# Patient Record
Sex: Male | Born: 1990 | Hispanic: No
Health system: Southern US, Community
[De-identification: ages and names within clinical notes are randomized; demographics above are authoritative.]

---

## 2016-09-15 ENCOUNTER — Emergency Department (HOSPITAL_COMMUNITY): Payer: No Typology Code available for payment source

## 2016-09-15 ENCOUNTER — Encounter (HOSPITAL_COMMUNITY): Payer: Self-pay | Admitting: Emergency Medicine

## 2016-09-15 ENCOUNTER — Emergency Department (HOSPITAL_COMMUNITY)
Admission: EM | Admit: 2016-09-15 | Discharge: 2016-09-15 | Disposition: A | Discharge status: Home / Self Care | Payer: No Typology Code available for payment source | Attending: Emergency Medicine | Admitting: Emergency Medicine

## 2016-09-15 DIAGNOSIS — M549 Dorsalgia, unspecified: Secondary | ICD-10-CM

## 2016-09-15 DIAGNOSIS — S39012A Strain of muscle, fascia and tendon of lower back, initial encounter: Secondary | ICD-10-CM | POA: Insufficient documentation

## 2016-09-15 DIAGNOSIS — R51 Headache: Secondary | ICD-10-CM | POA: Insufficient documentation

## 2016-09-15 DIAGNOSIS — M79622 Pain in left upper arm: Secondary | ICD-10-CM | POA: Diagnosis not present

## 2016-09-15 DIAGNOSIS — Y999 Unspecified external cause status: Secondary | ICD-10-CM | POA: Insufficient documentation

## 2016-09-15 DIAGNOSIS — Y939 Activity, unspecified: Secondary | ICD-10-CM | POA: Diagnosis not present

## 2016-09-15 DIAGNOSIS — Y92410 Unspecified street and highway as the place of occurrence of the external cause: Secondary | ICD-10-CM | POA: Insufficient documentation

## 2016-09-15 MED ORDER — HYDROCODONE-ACETAMINOPHEN 5-325 MG PO TABS
1.0000 | ORAL_TABLET | Freq: Once | ORAL | Status: AC
  Administered 2016-09-15: 1 via ORAL
  Filled 2016-09-15: qty 1
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
## 2016-09-15 NOTE — ED Notes (Signed)
Pt declined wheelchair upon discharge.  Pt ambulatory with steady gait
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
## 2023-08-13 DIAGNOSIS — R45851 Suicidal ideations: Secondary | ICD-10-CM | POA: Insufficient documentation

## 2023-08-13 DIAGNOSIS — Z79899 Other long term (current) drug therapy: Secondary | ICD-10-CM | POA: Insufficient documentation

## 2023-08-13 DIAGNOSIS — R456 Violent behavior: Secondary | ICD-10-CM | POA: Insufficient documentation

## 2023-08-13 DIAGNOSIS — F329 Major depressive disorder, single episode, unspecified: Secondary | ICD-10-CM | POA: Insufficient documentation

## 2023-08-13 DIAGNOSIS — F419 Anxiety disorder, unspecified: Secondary | ICD-10-CM | POA: Insufficient documentation

## 2023-08-14 ENCOUNTER — Emergency Department (HOSPITAL_COMMUNITY)
Admission: EM | Admit: 2023-08-14 | Discharge: 2023-08-14 | Disposition: A | Discharge status: Other Acute Inpatient Hospital | Attending: Emergency Medicine | Admitting: Emergency Medicine

## 2023-08-14 ENCOUNTER — Encounter (HOSPITAL_COMMUNITY): Payer: Self-pay

## 2023-08-14 ENCOUNTER — Other Ambulatory Visit: Payer: Self-pay

## 2023-08-14 ENCOUNTER — Encounter (HOSPITAL_COMMUNITY): Payer: Self-pay | Admitting: Psychiatry

## 2023-08-14 ENCOUNTER — Inpatient Hospital Stay (HOSPITAL_COMMUNITY)
Admission: AD | Admit: 2023-08-14 | Discharge: 2023-08-18 | DRG: 885 | Disposition: A | Discharge status: Home / Self Care | Source: Intra-hospital | Attending: Student in an Organized Health Care Education/Training Program | Admitting: Student in an Organized Health Care Education/Training Program

## 2023-08-14 DIAGNOSIS — F322 Major depressive disorder, single episode, severe without psychotic features: Secondary | ICD-10-CM | POA: Diagnosis present

## 2023-08-14 DIAGNOSIS — F411 Generalized anxiety disorder: Secondary | ICD-10-CM | POA: Insufficient documentation

## 2023-08-14 DIAGNOSIS — R45851 Suicidal ideations: Secondary | ICD-10-CM | POA: Diagnosis present

## 2023-08-14 DIAGNOSIS — F1210 Cannabis abuse, uncomplicated: Secondary | ICD-10-CM | POA: Diagnosis present

## 2023-08-14 DIAGNOSIS — F17210 Nicotine dependence, cigarettes, uncomplicated: Secondary | ICD-10-CM | POA: Diagnosis present

## 2023-08-14 DIAGNOSIS — Y906 Blood alcohol level of 120-199 mg/100 ml: Secondary | ICD-10-CM | POA: Diagnosis present

## 2023-08-14 DIAGNOSIS — F419 Anxiety disorder, unspecified: Secondary | ICD-10-CM | POA: Diagnosis not present

## 2023-08-14 DIAGNOSIS — F329 Major depressive disorder, single episode, unspecified: Principal | ICD-10-CM | POA: Diagnosis present

## 2023-08-14 DIAGNOSIS — Z9151 Personal history of suicidal behavior: Secondary | ICD-10-CM | POA: Diagnosis not present

## 2023-08-14 DIAGNOSIS — F1010 Alcohol abuse, uncomplicated: Secondary | ICD-10-CM | POA: Diagnosis present

## 2023-08-14 DIAGNOSIS — Z046 Encounter for general psychiatric examination, requested by authority: Secondary | ICD-10-CM

## 2023-08-14 DIAGNOSIS — Z560 Unemployment, unspecified: Secondary | ICD-10-CM | POA: Diagnosis not present

## 2023-08-14 DIAGNOSIS — F332 Major depressive disorder, recurrent severe without psychotic features: Secondary | ICD-10-CM | POA: Diagnosis not present

## 2023-08-14 LAB — RAPID URINE DRUG SCREEN, HOSP PERFORMED
Amphetamines: NOT DETECTED
Barbiturates: NOT DETECTED
Benzodiazepines: NOT DETECTED
Cocaine: NOT DETECTED
Opiates: NOT DETECTED
Tetrahydrocannabinol: POSITIVE — AB

## 2023-08-14 LAB — COMPREHENSIVE METABOLIC PANEL WITH GFR
ALT: 26 U/L (ref 0–44)
AST: 24 U/L (ref 15–41)
Albumin: 4.9 g/dL (ref 3.5–5.0)
Alkaline Phosphatase: 33 U/L — ABNORMAL LOW (ref 38–126)
Anion gap: 13 (ref 5–15)
BUN: 8 mg/dL (ref 6–20)
CO2: 21 mmol/L — ABNORMAL LOW (ref 22–32)
Calcium: 8.9 mg/dL (ref 8.9–10.3)
Chloride: 102 mmol/L (ref 98–111)
Creatinine, Ser: 0.78 mg/dL (ref 0.61–1.24)
GFR, Estimated: >60 mL/min (ref >60)
Glucose, Bld: 91 mg/dL (ref 70–99)
Potassium: 3.5 mmol/L (ref 3.5–5.1)
Sodium: 136 mmol/L (ref 135–145)
Total Bilirubin: 0.9 mg/dL (ref 0.0–1.2)
Total Protein: 8.6 g/dL — ABNORMAL HIGH (ref 6.5–8.1)

## 2023-08-14 LAB — CBC WITH DIFFERENTIAL/PLATELET
Abs Immature Granulocytes: 0.01 K/uL (ref 0.00–0.07)
Basophils Absolute: 0 K/uL (ref 0.0–0.1)
Basophils Relative: 0 %
Eosinophils Absolute: 0.2 K/uL (ref 0.0–0.5)
Eosinophils Relative: 2 %
HCT: 46.8 % (ref 39.0–52.0)
Hemoglobin: 16.1 g/dL (ref 13.0–17.0)
Immature Granulocytes: 0 %
Lymphocytes Relative: 22 %
Lymphs Abs: 1.9 K/uL (ref 0.7–4.0)
MCH: 31.4 pg (ref 26.0–34.0)
MCHC: 34.4 g/dL (ref 30.0–36.0)
MCV: 91.2 fL (ref 80.0–100.0)
Monocytes Absolute: 0.6 K/uL (ref 0.1–1.0)
Monocytes Relative: 7 %
Neutro Abs: 5.8 K/uL (ref 1.7–7.7)
Neutrophils Relative %: 69 %
Platelets: 235 K/uL (ref 150–400)
RBC: 5.13 MIL/uL (ref 4.22–5.81)
RDW: 11.8 % (ref 11.5–15.5)
WBC: 8.5 K/uL (ref 4.0–10.5)
nRBC: 0 % (ref 0.0–0.2)

## 2023-08-14 LAB — SALICYLATE LEVEL: Salicylate Lvl: <7 mg/dL — ABNORMAL LOW (ref 7.0–30.0)

## 2023-08-14 LAB — ACETAMINOPHEN LEVEL: Acetaminophen (Tylenol), Serum: <10 ug/mL — ABNORMAL LOW (ref 10–30)

## 2023-08-14 LAB — ETHANOL: Alcohol, Ethyl (B): 181 mg/dL — ABNORMAL HIGH (ref <15)

## 2023-08-14 MED ORDER — DIPHENHYDRAMINE HCL 25 MG PO CAPS: 50.0000 mg | ORAL_CAPSULE | Freq: Three times a day (TID) | ORAL | Status: DC | PRN

## 2023-08-14 MED ORDER — HALOPERIDOL 5 MG PO TABS: 5.0000 mg | ORAL_TABLET | Freq: Three times a day (TID) | ORAL | Status: DC | PRN

## 2023-08-14 MED ORDER — LORAZEPAM 2 MG/ML IJ SOLN: 2.0000 mg | Freq: Three times a day (TID) | INTRAMUSCULAR | Status: DC | PRN

## 2023-08-14 MED ORDER — MAGNESIUM HYDROXIDE 400 MG/5ML PO SUSP: 30.0000 mL | Freq: Every day | ORAL | Status: DC | PRN

## 2023-08-14 MED ORDER — DIPHENHYDRAMINE HCL 50 MG/ML IJ SOLN: 50.0000 mg | Freq: Three times a day (TID) | INTRAMUSCULAR | Status: DC | PRN

## 2023-08-14 MED ORDER — ALUM & MAG HYDROXIDE-SIMETH 200-200-20 MG/5ML PO SUSP: 30.0000 mL | ORAL | Status: DC | PRN

## 2023-08-14 MED ORDER — ACETAMINOPHEN 325 MG PO TABS: 650.0000 mg | ORAL_TABLET | Freq: Four times a day (QID) | ORAL | Status: DC | PRN

## 2023-08-14 MED ORDER — HALOPERIDOL LACTATE 5 MG/ML IJ SOLN: 10.0000 mg | Freq: Three times a day (TID) | INTRAMUSCULAR | Status: DC | PRN

## 2023-08-14 MED ORDER — DIPHENHYDRAMINE HCL 25 MG PO CAPS
25.0000 mg | ORAL_CAPSULE | Freq: Four times a day (QID) | ORAL | Status: DC | PRN
  Administered 2023-08-14: 25 mg via ORAL
  Filled 2023-08-14: qty 1

## 2023-08-14 MED ORDER — NICOTINE POLACRILEX 2 MG MT GUM
2.0000 mg | CHEWING_GUM | OROMUCOSAL | Status: DC | PRN
  Administered 2023-08-14 – 2023-08-17 (×14): 2 mg via ORAL
  Filled 2023-08-14 (×10): qty 1

## 2023-08-14 MED ORDER — HYDROXYZINE HCL 25 MG PO TABS
25.0000 mg | ORAL_TABLET | Freq: Three times a day (TID) | ORAL | Status: DC | PRN
  Filled 2023-08-14 (×2): qty 1

## 2023-08-14 MED ORDER — HALOPERIDOL LACTATE 5 MG/ML IJ SOLN: 5.0000 mg | Freq: Three times a day (TID) | INTRAMUSCULAR | Status: DC | PRN

## 2023-08-14 NOTE — Tx Team (Signed)
 Initial Treatment Plan 08/14/2023 3:37 PM Marsa CHRISTELLA Bohr FMW:[id number]    PATIENT STRESSORS: Legal issue   Marital or family conflict     PATIENT STRENGTHS: Average or above average intelligence  Capable of independent living  Motivation for treatment/growth    PATIENT IDENTIFIED PROBLEMS: I want to be happy again.  Coping skills                   DISCHARGE CRITERIA:  Adequate post-discharge living arrangements Improved stabilization in mood, thinking, and/or behavior  PRELIMINARY DISCHARGE PLAN: Attend 12-step recovery group Outpatient therapy  PATIENT/FAMILY INVOLVEMENT: This treatment plan has been presented to and reviewed with the patient, DRAYCE TAWIL.  The patient has been given the opportunity to ask questions and make suggestions.  Eleanor JAYSON Metro, RN 08/14/2023, 3:37 PM

## 2023-08-14 NOTE — [facility] Group Notes (Signed)
 [facility] Group Notes:  (Nursing/MHT/Case Management/Adjunct)  Date:  08/14/2023  Time:  8:50 PM  Type of Therapy:  Wrap-up group  Participation Level:  Did Not Attend  Participation Quality:    Affect:    Cognitive:    Insight:    Engagement in Group:    Modes of Intervention:    Summary of Progress/Problems: Didn't attend.  Grayce LITTIE Essex 08/14/2023, 8:50 PM

## 2023-08-14 NOTE — Progress Notes (Signed)
   08/14/23 2130  Psych Admission Type (Psych Patients Only)  Admission Status Involuntary  Psychosocial Assessment  Patient Complaints Anxiety;Depression;Worrying  Eye Contact Fair  Facial Expression Flat  Affect Depressed;Sad  Speech Logical/coherent  Interaction No initiation  Motor Activity Other (Comment) (wnl)  Appearance/Hygiene Unremarkable  Behavior Characteristics Cooperative  Mood Pleasant  Thought Process  Coherency WDL  Content WDL  Delusions None reported or observed  Perception WDL  Hallucination None reported or observed  Judgment WDL  Confusion None  Danger to Self  Current suicidal ideation? Denies

## 2023-08-14 NOTE — Plan of Care (Signed)
  Problem: Activity: Goal: Interest or engagement in leisure activities will improve Outcome: Progressing Goal: Imbalance in normal sleep/wake cycle will improve Outcome: Progressing   Problem: Coping: Goal: Coping ability will improve Outcome: Progressing

## 2023-08-14 NOTE — ED Notes (Addendum)
 Sheriff called for transport, did not state any time but stated that he is on the list. Called report at [facility] receiving RN, Melissa, R. Pt going to room 300 bed 2.
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
## 2023-08-14 NOTE — ED Notes (Signed)
 Patient asleep at this time.

## 2023-08-14 NOTE — Consult Note (Signed)
 [facility] Psychiatric Consult Initial  Patient Name: .Vincent Carrillo  MRN: [id number]  DOB: February 09, 1990  Consult Order details:  Orders (From admission, onward)     Start     Ordered   08/14/23 0231  CONSULT TO CALL ACT TEAM       Ordering Provider: Jarold Olam CHRISTELLA, PA-C  Provider:  (Not yet assigned)  Question:  Reason for Consult?  Answer:  Psych consult   08/14/23 0230             Mode of Visit: In person    Psychiatry Consult Evaluation  Service Date: August 14, 2023 LOS:  LOS: 0 days  Chief Complaint found by [employer] leaning over the bridge railing [place] a highway. PT stated that he was done, and defeated.   Primary Psychiatric Diagnoses  Major depressive disorder 2.   Anxiety disorder   Assessment  Vincent Carrillo is a 33 y.o. male admitted: Presented to the EDfor 08/14/2023 12:02 AM for that he was done and defeated while leaning over a bridge railing. He carries the psychiatric diagnoses of none and has a past medical history of none.   His current presentation of irritability, minimizing, despair and defeat is most consistent with decompensated mental health. He meets criteria for inpatient psychiatric admission based on current symptoms.  Current outpatient psychotropic medications include none. On initial examination, patient irritable, but cooperative. Please see plan below for detailed recommendations.   Diagnoses:  Active Hospital problems: Principal Problem:   MDD (major depressive disorder) Active Problems:   Anxiety state    Plan   ## Psychiatric Medication Recommendations:  Atarax  25 mg p.o. 3 times daily as needed for anxiety  ## Medical Decision Making Capacity: Not specifically addressed in this encounter  ## Further Work-up:  -- No further workup needed at this time EKG or UDS -- most recent EKG on 08/14/2023 had QtC of 425 -- Pertinent labwork reviewed earlier this admission includes: CBC, CMP, EKG, UDS   ## Disposition:-- We  recommend inpatient psychiatric hospitalization. Patient has been involuntarily committed on 08/13/2023.  Patient has been too accepted to the behavioral health Hospital on 08/14/2018.  ## Behavioral / Environmental: -To minimize splitting of staff, assign one staff person to communicate all information from the team when feasible. or Utilize compassion and acknowledge the patient's experiences while setting clear and realistic expectations for care.    ## Safety and Observation Level:  - Based on my clinical evaluation, I estimate the patient to be at low  risk of self harm in the current setting. - At this time, we recommend  routine. This decision is based on my review of the chart including patient's history and current presentation, interview of the patient, mental status examination, and consideration of suicide risk including evaluating suicidal ideation, plan, intent, suicidal or self-harm behaviors, risk factors, and protective factors. This judgment is based on our ability to directly address suicide risk, implement suicide prevention strategies, and develop a safety plan while the patient is in the clinical setting. Please contact our team if there is a concern that risk level has changed.  CSSR Risk Category:C-SSRS RISK CATEGORY: High Risk  Suicide Risk Assessment: Patient has following modifiable risk factors for suicide: untreated depression, recklessness, current symptoms: anxiety/panic, insomnia, impulsivity, anhedonia, hopelessness, and triggering events, which we are addressing by recommendation patient's psychiatric admission. Patient has following non-modifiable or demographic risk factors for suicide: male gender and history of suicide attempt Patient has the following protective factors  against suicide: Supportive family  Thank you for this consult request. Recommendations have been communicated to the primary team.  We will continue to follow patient at this time.   CATHALEEN ADAM, PMHNP       History of Present Illness  Relevant Aspects of Hospital ED Course:  Admitted on 08/14/2023 33 year old male with history of bipolar disorder, presenting to the ED via [employer] and placed under emergency IVC. Per police, he was found leaning over the side of a bridge and was pulled back to safety. Apparently had stated to police that he was done. Does have history of prior suicide attempts.   Patient Report:  Vincent Carrillo, 33 y.o., male patient seen face to face by this provider, consulted with Dr. Larina; and chart reviewed on 08/14/23.  On evaluation Vincent Carrillo reports that he was walking along a bridge, smoking a cigarette, he was just playing over the bridge, with no intent of jumping over.  Patient is minimizing and guarded with story, not forthcoming about her [facility] Department was called or involved, minimizes why he was at the [place].  He states that he currently lives with his parents, but most days he lives off and on with his girlfriend.  States he currently is not working due to [employer], states he is struggling financially because of the DUIs, states he walks everywhere, does not drive anymore.  He states that he has a good relationship with his parents, denies ever being admitted to inpatient psychiatric hospital.  Currently denies SI/HI/AVH.  States that he does see a therapist once a week through [facility], denies taking any medication, states he has tried before Xanax, and Zoloft prescriptions, states that it made him feel out of place, he was not comfortable being on the medications.  He continues to state that the past 4 years he feels that his life has gone downhill due to his relationship with his girlfriend and because of the DUIs.  He states his appetite has been poor and he eats about once a day, sleeps about 6 hours a night.  Patient endorses smoking marijuana about 3 to 6 g a week, and drinks liquor/beer about once or twice a  week, BAL is 181.   During evaluation Vincent Carrillo is in moderate distress, as he feels he does not need to be here.  He is alert, oriented x 4, labile mood, but cooperative.  His mood is labile with congruent affect. He has normal speech, and behavior.  Objectively there is no evidence of psychosis/mania or delusional thinking.  Patient is able to converse coherently, goal directed thoughts, no distractibility, or pre-occupation. He currently denies suicidal/self-harm/homicidal ideation, psychosis, and paranoia.   Patient does not have any past inpatient psychiatric admission, but did inform the police officer last night that he may have bipolar disorder with no formal diagnosis, patient was brought down from side of the bridge by police after threatening to jump off.  Patient states that when he was a teenager he did have a history of prior suicide attempt.   Psych ROS:  Depression: Endorses Anxiety: Endorses Mania (lifetime and current): Denies Psychosis: (lifetime and current): Denies  Collateral information:  Spoke with patient's mother Edsel Hailstone, with patient's permission, who was here at the emergency department with her son.  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  She states her son used to be a very strong man, making his own decisions, but now he depends on his girlfriend for everything.  She states there is a family history of mental health, her sister has depression.  She states she is unsure of patient intentionally trying to harm himself or if he is trying to get attention.  She states that last conversation he did tell her that he was feeling lost.   Review of Systems  Psychiatric/Behavioral:  Positive for depression, substance abuse and suicidal ideas.      Psychiatric and Social History  Psychiatric  History:  Information collected from patient mother and patient   Prev Dx/Sx: None Current Psych Provider: None Home Meds (current): None Previous Med Trials: Yes Therapy: Yes  Prior Psych Hospitalization: Denies   Prior Self Harm: Yes Prior Violence: Denies  Family Psych History: Yes Family Hx suicide: Denies  Social History:  Developmental Hx: deferred Educational Hx: Graduated high school Occupational Hx: Unemployed  Legal Hx: Yes Living Situation: Lives with parents  Spiritual Hx: Yes Access to weapons/lethal means: Denies    Substance History Alcohol: Yes  Type of alcohol beer/ liquor  Last Drink yesterday  Number of drinks per day occasionally  History of alcohol withdrawal seizures Denies  History of DT's Denies  Tobacco: Yes Illicit drugs: Yes Prescription drug abuse: Denies Rehab hx: Denies   Exam Findings  Physical Exam:  Vital Signs:  Temp:  [97.8 F (36.6 C)-98.3 F (36.8 C)] 98.3 F (36.8 C) (07/19 1024) Pulse Rate:  [73-94] 76 (07/19 1024) Resp:  [18] 18 (07/19 1024) BP: (115-130)/(68-95) 115/70 (07/19 1024) SpO2:  [96 %-99 %] 99 % (07/19 1024) Weight:  [88 kg] 88 kg (07/19 0007) Blood pressure 115/70, pulse 76, temperature 98.3 F (36.8 C), temperature source Oral, resp. rate 18, height 6' 2 (1.88 m), weight 88 kg, SpO2 99%. Body mass index is 24.91 kg/m.  Physical Exam Vitals and nursing note reviewed. Exam conducted with a chaperone present.  Neurological:     Mental Status: He is alert.  Psychiatric:        Attention and Perception: Attention normal.        Mood and Affect: Mood is depressed. Affect is labile.        Speech: Speech normal.        Behavior: Behavior is agitated. Behavior is cooperative.        Thought Content: Thought content includes suicidal ideation.        Cognition and Memory: Memory normal.        Judgment: Judgment is inappropriate.     Mental Status Exam: General Appearance: Casual  Orientation:  Full  (Time, Place, and Person)  Memory:  Immediate;   Fair Remote;   Fair  Concentration:  Concentration: Fair and Attention Span: Fair  Recall:  Fair  Attention  Fair  Eye Contact:  Fair  Speech:  Clear and Coherent  Language:  Fair  Volume:  Normal  Mood: labile  Affect:  Labile  Thought Process:  Coherent  Thought Content:  WDL  Suicidal Thoughts:  Yes.  without intent/plan  Homicidal Thoughts:  No  Judgement:  Intact  Insight:  Fair  Psychomotor Activity:  Normal  Akathisia:  NA  Fund of Knowledge:  Fair      Assets:  Manufacturing systems engineer Desire for Improvement Housing Social Support  Cognition:  WNL  ADL's:  Intact  AIMS (  if indicated):        Other History   These have been pulled in through the EMR, reviewed, and updated if appropriate.  Family History:  The patient's family history is not on file.  Medical History: History reviewed. No pertinent past medical history.  Surgical History: History reviewed. No pertinent surgical history.   Medications:  No current facility-administered medications for this encounter.  Current Outpatient Medications:    ibuprofen (ADVIL,MOTRIN) 200 MG tablet, Take 200-400 mg by mouth every 6 (six) hours as needed (for pain or headaches). , Disp: , Rfl:   Allergies: Allergies  Allergen Reactions   Peanut-Containing Drug Products Anaphylaxis    Mujtaba Bollig MOTLEY-MANGRUM, PMHNP

## 2023-08-14 NOTE — ED Triage Notes (Signed)
 PT was found by [employer] leaning [place] the bridge railing over a highway. PT stated that he was done, and defeated.

## 2023-08-14 NOTE — ED Provider Notes (Signed)
 [place] EMERGENCY DEPARTMENT AT [facility] Provider Note   CSN: [id number] Arrival date & time: 08/13/23  2359     Patient presents with: Suicidal (PT was found by [employer] leaning over the bridge railing over a highway. PT stated that he was done, and defeated.)   Vincent Carrillo is a 33 y.o. male.   The history is provided by the patient and medical records.   33 year old male with history of bipolar disorder, presenting to the ED via [employer] and placed under emergency IVC.  Per police, he was found leaning over the side of a bridge and was pulled back to safety.  Apparently had stated to police that he was done.  Does have history of prior suicide attempts.  Per patient, he contradicts statements made in IVC paperwork.  States he was just smoking a cigarette and thinking.  He denies suicidal intent.  He states if I wanted to kill myself I would not be here.  Prior to Admission medications   Medication Sig Start Date End Date Taking? Authorizing Provider  ibuprofen (ADVIL,MOTRIN) 200 MG tablet Take 200-400 mg by mouth every 6 (six) hours as needed (for pain or headaches).     [provider]    Allergies: Patient has no known allergies.    Review of Systems  Psychiatric/Behavioral:  Positive for suicidal ideas.   All other systems reviewed and are negative.   Updated Vital Signs BP (!) 130/95   Pulse 94   Temp 98.3 F (36.8 C)   Resp 18   Ht 6' 2 (1.88 m)   Wt 88 kg   SpO2 97%   BMI 24.91 kg/m   Physical Exam Vitals and nursing note reviewed.  Constitutional:      Appearance: He is well-developed.  HENT:     Head: Normocephalic and atraumatic.  Eyes:     Conjunctiva/sclera: Conjunctivae normal.     Pupils: Pupils are equal, round, and reactive to light.  Cardiovascular:     Rate and Rhythm: Normal rate and regular rhythm.     Heart sounds: Normal heart sounds.  Pulmonary:     Effort: Pulmonary effort is normal. No respiratory  distress.     Breath sounds: Normal breath sounds. No rhonchi.  Musculoskeletal:        General: Normal range of motion.     Cervical back: Normal range of motion.  Skin:    General: Skin is warm and dry.  Neurological:     Mental Status: He is alert and oriented to person, place, and time.  Psychiatric:     Comments: Denies SI when asked directly Denies HI/AVH     (all labs ordered are listed, but only abnormal results are displayed) Labs Reviewed  COMPREHENSIVE METABOLIC PANEL WITH GFR - Abnormal; Notable for the following components:      Result Value   CO2 21 (*)    Total Protein 8.6 (*)    Alkaline Phosphatase 33 (*)    All other components within normal limits  ETHANOL - Abnormal; Notable for the following components:   Alcohol, Ethyl (B) 181 (*)    All other components within normal limits  SALICYLATE LEVEL - Abnormal; Notable for the following components:   Salicylate Lvl <7.0 (*)    All other components within normal limits  ACETAMINOPHEN  LEVEL - Abnormal; Notable for the following components:   Acetaminophen  (Tylenol ), Serum <10 (*)    All other components within normal limits  CBC WITH DIFFERENTIAL/PLATELET  RAPID URINE DRUG SCREEN, HOSP PERFORMED    EKG: EKG Interpretation Date/Time:  Saturday August 14 2023 00:08:08 EDT Ventricular Rate:  96 PR Interval:  146 QRS Duration:  86 QT Interval:  336 QTC Calculation: 425 R Axis:   69  Text Interpretation: Sinus rhythm Confirmed by Nettie, April ([id number]) on 08/14/2023 12:12:32 AM  Radiology: No results found.   Procedures   Medications Ordered in the ED - No data to display                                  Medical Decision Making Amount and/or Complexity of Data Reviewed Labs: ordered. ECG/medicine tests: ordered and independent interpretation performed.   33 year old male brought in by [employer] under IVC after they pulled him back from the ledge of a bridge.  He was apparently threatening to jump.   Patient denies this stating if I am wanted to be dead I would be dead.  Denies current SI/HI.  Labs as above--no leukocytosis.  No significant electrolyte derangement.  Ethanol 181.  Negative APAP and salicylate levels.  UDS pending.  Medically cleared.  Will get TTS evaluation.  First exam filed.  Final diagnoses:  Involuntary commitment    ED Discharge Orders     None          Jarold Olam CHRISTELLA DEVONNA 08/14/23 0559    Palumbo, April, MD 08/14/23 [phone]

## 2023-08-14 NOTE — Plan of Care (Signed)
   Problem: Education: Goal: Emotional status will improve Outcome: Progressing Goal: Verbalization of understanding the information provided will improve Outcome: Progressing

## 2023-08-14 NOTE — ED Notes (Signed)
 Pt has been wanded and belongings in [place] B cabinet 2 bags one with clothes other bag pt boots both labeled

## 2023-08-14 NOTE — Group Note (Signed)
 Date/Time:  @TD @ 1:00-2:00  Type of Therapy and Topic:  Group Therapy:  Safety  Participation Level:  Did Not Attend   Description of Group This process group involved a discussion with and between patients about Safety.   This group revolves around central idea: You need to stay safe, the patient will learn to cope safely, no matter what negative life events come their way. The patient will identify and describe their safe and unsafe environment.   This then was followed by a discussion about how the patient are committed to their self, what it is, how important it is, and why we choose the coping techniques.  Participants were encouraged to think about commitment as necessary and positive.  Therapeutic Goals Patient will identify and describe one their safety Patient will participate in generating ideas about safety and how it can impact their environment The patient are encourage to explore their safety and who they would want to be their safety person.   Summary of Patient Progress:  The patient did not attend   Therapeutic Modalities Brief Solution-Focused Therapy Psychoeducation  Lizmary Nader O Keylee Shrestha, LCSWA 08/14/2023  4:27 PM

## 2023-08-15 DIAGNOSIS — F332 Major depressive disorder, recurrent severe without psychotic features: Secondary | ICD-10-CM | POA: Diagnosis not present

## 2023-08-15 NOTE — Progress Notes (Addendum)
 D. Pt presents with a flat affect, depressed mood, and calm, cooperative behavior. Pt insistent that he doesn't need to be here- that he wasn't planning on ending his life. Pt stated, I was just thinking about everything, smoking a cigarette. Pt currently denies SI/HI and AVH and doesn't appear to be responding to internal stimuli.  A. Labs and vitals monitored. Pt supported emotionally and encouraged to express concerns and ask questions.   R. Pt remains safe with 15 minute checks. Will continue POC.    08/15/23 1300  Psych Admission Type (Psych Patients Only)  Admission Status Involuntary  Psychosocial Assessment  Patient Complaints Worrying;Depression  Eye Contact Fair  Facial Expression Sad  Affect Depressed  Speech Logical/coherent  Interaction Minimal  Motor Activity Other (Comment) (steady gait)  Appearance/Hygiene Unremarkable  Behavior Characteristics Cooperative  Mood Depressed;Pleasant  Thought Process  Coherency WDL  Content WDL  Delusions None reported or observed  Perception WDL  Hallucination None reported or observed  Judgment WDL  Confusion None  Danger to Self  Current suicidal ideation? Denies  Danger to Others  Danger to Others None reported or observed

## 2023-08-15 NOTE — Plan of Care (Signed)
   Problem: Education: Goal: Knowledge of [place] [facility] Education information/materials will improve Outcome: Progressing Goal: Verbalization of understanding the information provided will improve Outcome: Progressing

## 2023-08-15 NOTE — Progress Notes (Signed)
(  Sleep Hours) -6 (Any PRNs that were needed, meds refused, or side effects to meds)- Benadryl  25 mg (Any disturbances and when (visitation, over night)- none (Concerns raised by the patient)-  (SI/HI/AVH)- denies all ,  pt reported he is concerned about how he is going to pay for this hospitalization b/c he has no insurance along with other personal stressors

## 2023-08-15 NOTE — Group Note (Signed)
 Date:  08/15/2023 Time:  9:07 PM  Group Topic/Focus:  Wrap-Up Group:   The focus of this group is to help patients review their daily goal of treatment and discuss progress on daily workbooks.    Participation Level:  Did Not Attend  Participation Quality:  Resistant  Affect:  Resistant  Cognitive:  Lacking  Insight: None  Engagement in Group:  None  Modes of Intervention:  Discussion  Additional Comments:  Patient did not attend wrap up group this evening   Bari Moats 08/15/2023, 9:07 PM

## 2023-08-15 NOTE — Group Note (Signed)
 Date:  08/15/2023 Time:  9:12 AM  Group Topic/Focus:  Goals Group:   The focus of this group is to help patients establish daily goals to achieve during treatment and discuss how the patient can incorporate goal setting into their daily lives to aide in recovery. Orientation:   The focus of this group is to educate the patient on the purpose and policies of crisis stabilization and provide a format to answer questions about their admission.  The group details unit policies and expectations of patients while admitted.    Participation Level:  Did Not Attend
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
## 2023-08-15 NOTE — BHH Suicide Risk Assessment (Signed)
 Suicide Risk Assessment  Admission Assessment    [facility] Admission Suicide Risk Assessment   Nursing information obtained from:    Demographic factors:  Male, Caucasian Current Mental Status:  NA Loss Factors:  Financial problems / change in socioeconomic status Historical Factors:  NA Risk Reduction Factors:  Positive social support, Living with another person, especially a relative  Total Time spent with patient: 30 minutes Principal Problem: MDD (major depressive disorder) Diagnosis:  Principal Problem:   MDD (major depressive disorder)   Subjective Data:   Vincent Carrillo is a 33 y.o. male  with no past psychiatric history . Patient initially arrived to [facility] on 7/19 for reported suicidal ideations, and admitted to [facility] under IVC on 7/19 for acute safety concerns and acute suicidal or self-harming behaviors. Per [employer], patient was found leaning over the side of a bridge and voiced SI to police (stated to police that he was 'done'.) Was involuntarily committed in the ED for suicidal thinking, although he denied suicidal ideation throughout hospitalization. PMHx is insignificant.   Continued Clinical Symptoms:  Alcohol Use Disorder Identification Test Final Score (AUDIT): 12 The Alcohol Use Disorders Identification Test, Guidelines for Use in Primary Care, Second Edition.  World Science writer [facility]). Score between 0-7:  no or low risk or alcohol related problems. Score between 8-15:  moderate risk of alcohol related problems. Score between 16-19:  high risk of alcohol related problems. Score 20 or above:  warrants further diagnostic evaluation for alcohol dependence and treatment.   CLINICAL FACTORS:   Depression:   Anhedonia Comorbid alcohol abuse/dependence Hopelessness Alcohol/Substance Abuse/Dependencies More than one psychiatric diagnosis Unstable or Poor Therapeutic Relationship Previous Psychiatric Diagnoses and Treatments   Physical Exam Vitals reviewed.   Constitutional:      General: He is not in acute distress. Pulmonary:     Effort: Pulmonary effort is normal. No respiratory distress.  Neurological:     Mental Status: He is alert.    Review of Systems  Constitutional:  Negative for chills and fever.  All other systems reviewed and are negative.   COGNITIVE FEATURES THAT CONTRIBUTE TO RISK:  Closed-mindedness, Loss of executive function, and Thought constriction (tunnel vision)    SUICIDE RISK:  Severe:  Patient has two reported incidents of suicidal gestures in one week: standing on train tracks last Sunday per girlfriend and standing on a bridge, severely depressed per [employer]. Patient denies present suicidal ideation, however, he is evasive and inconsistent throughout interview. Denied all symptoms of major depression despite extensive collateral information to the contrary: per dad he has been down and depressed, having broke down at various points, and per girlfriend he is the worst he's ever been. Has voiced passive suicidal ideation to dad. Has a past history of failed suicide attempt by putting a gun to his head and pulling the trigger. Inconsistent about access to firearms: has access to father's firearms, per father, but denied firearm access to interviewers. Because of his unwillingness to engage with treatment, apparent minimizing, and previous serious suicide attempt, I believe he is at a very high risk for suicide at present.   PLAN OF CARE: See H&P for assessment and plan.   I certify that inpatient services furnished can reasonably be expected to improve the patient's condition.   Maniyah Moller, MD 08/15/2023, 1:03 PM

## 2023-08-15 NOTE — Progress Notes (Signed)
   08/15/23 1950  Psych Admission Type (Psych Patients Only)  Admission Status Involuntary  Psychosocial Assessment  Patient Complaints Depression  Eye Contact Fair  Facial Expression Flat  Affect Depressed;Sad  Speech Logical/coherent  Interaction No initiation  Motor Activity Other (Comment) (wnl)  Appearance/Hygiene Unremarkable  Behavior Characteristics Cooperative  Mood Depressed  Thought Process  Coherency WDL  Content WDL  Delusions None reported or observed  Perception WDL  Hallucination None reported or observed  Judgment WDL  Confusion None  Danger to Self  Current suicidal ideation? Denies

## 2023-08-16 ENCOUNTER — Encounter (HOSPITAL_COMMUNITY): Payer: Self-pay

## 2023-08-16 DIAGNOSIS — F332 Major depressive disorder, recurrent severe without psychotic features: Secondary | ICD-10-CM | POA: Diagnosis not present

## 2023-08-16 NOTE — Progress Notes (Signed)
 [facility] MD Progress Note  08/16/2023 9:51 AM Marsa CHRISTELLA Bohr  MRN:  [id number] Subjective:   ELDO UMANZOR is a 33 year old male with a past psychiatric history notable for a prior suicide attempt with a firearm in childhood, who presented involuntarily to [facility] on 7/20 from [facility] Emergency Department following a suicidal attempt where he was found by police on 7/19 leaning over the side of a bridge.  Case was discussed in the multidisciplinary team. MAR was reviewed and patient refuses all medication except nicotine  gum.  Psychiatric Team made the following recommendations yesterday: -Continue IVC -Revisit question of starting medication for depressed mood  On interview today patient reports he slept good last night.  He reports his appetite is good.  He reports no SI, HI, and AVH.  He reports no paranoia or ideas of reference.  He he still denies all medications stating I am fine and I do not need medications.  Principal Problem: MDD (major depressive disorder) Diagnosis: Principal Problem:   MDD (major depressive disorder)  Total Time spent with patient: 45 minutes  Past Psychiatric History:  Current psychiatrist: Denies  Current therapist: Sees a therapist once a month from [facility] Previous psychiatric diagnoses: Denies Psychiatric hospitalization(s): Denies Psychotherapy history: Denies Neuromodulation history: Denies History of suicide (obtained from HPI): Attempted suicide with a firearm in childhood History of homicide or aggression (obtained in HPI): Denies  Past Medical History: History reviewed. No pertinent past medical history. History reviewed. No pertinent surgical history. Family History: History reviewed. No pertinent family history. Family Psychiatric  History: Denies Social History:  Social History   Substance and Sexual Activity  Alcohol Use Not Currently     Social History   Substance and Sexual Activity  Drug Use Yes   Types: Marijuana     Social History   Socioeconomic History   Marital status: Unknown    Spouse name: Not on file   Number of children: Not on file   Years of education: Not on file   Highest education level: Not on file  Occupational History   Not on file  Tobacco Use   Smoking status: Every Day    Types: Cigarettes   Smokeless tobacco: Never  Substance and Sexual Activity   Alcohol use: Not Currently   Drug use: Yes    Types: Marijuana   Sexual activity: Yes  Other Topics Concern   Not on file  Social History Narrative   Not on file   Social Drivers of Health   Financial Resource Strain: Not on file  Food Insecurity: No Food Insecurity (08/14/2023)   Hunger Vital Sign    Worried About Running Out of Food in the Last Year: Never true    Ran Out of Food in the Last Year: Never true  Transportation Needs: No Transportation Needs (08/14/2023)   PRAPARE - Administrator, Civil Service (Medical): No    Lack of Transportation (Non-Medical): No  Physical Activity: Not on file  Stress: Not on file  Social Connections: Not on file   Additional Social History:   Sleep: Good Estimated Sleeping Duration (Last 24 Hours): 5.25-7.50 hours  Appetite:  Good  Current Medications: Current Facility-Administered Medications  Medication Dose Route Frequency Provider Last Rate Last Admin   acetaminophen  (TYLENOL ) tablet 650 mg  650 mg Oral Q6H PRN Motley-Mangrum, Jadeka A, PMHNP       alum & mag hydroxide-simeth (MAALOX/MYLANTA) 200-200-20 MG/5ML suspension 30 mL  30 mL Oral Q4H PRN Motley-Mangrum, Jadeka  A, PMHNP       diphenhydrAMINE  (BENADRYL ) capsule 25 mg  25 mg Oral Q6H PRN Bobbitt, Shalon E, NP   25 mg at 08/14/23 2132   haloperidol  (HALDOL ) tablet 5 mg  5 mg Oral TID PRN Motley-Mangrum, Jadeka A, PMHNP       And   diphenhydrAMINE  (BENADRYL ) capsule 50 mg  50 mg Oral TID PRN Motley-Mangrum, Jadeka A, PMHNP       haloperidol  lactate (HALDOL ) injection 5 mg  5 mg Intramuscular TID PRN  Motley-Mangrum, Jadeka A, PMHNP       And   diphenhydrAMINE  (BENADRYL ) injection 50 mg  50 mg Intramuscular TID PRN Motley-Mangrum, Jadeka A, PMHNP       And   LORazepam  (ATIVAN ) injection 2 mg  2 mg Intramuscular TID PRN Motley-Mangrum, Jadeka A, PMHNP       haloperidol  lactate (HALDOL ) injection 10 mg  10 mg Intramuscular TID PRN Motley-Mangrum, Jadeka A, PMHNP       And   diphenhydrAMINE  (BENADRYL ) injection 50 mg  50 mg Intramuscular TID PRN Motley-Mangrum, Jadeka A, PMHNP       And   LORazepam  (ATIVAN ) injection 2 mg  2 mg Intramuscular TID PRN Motley-Mangrum, Jadeka A, PMHNP       hydrOXYzine  (ATARAX ) tablet 25 mg  25 mg Oral TID PRN Motley-Mangrum, Jadeka A, PMHNP       magnesium  hydroxide (MILK OF MAGNESIA) suspension 30 mL  30 mL Oral Daily PRN Motley-Mangrum, Jadeka A, PMHNP       nicotine  polacrilex (NICORETTE ) gum 2 mg  2 mg Oral PRN Pashayan, Lem S, DO   2 mg at 08/15/23 2035    Lab Results:  Results for orders placed or performed during the hospital encounter of 08/14/23 (from the past 48 hours)  Rapid urine drug screen (hospital performed)     Status: Abnormal   Collection Time: 08/14/23 10:27 AM  Result Value Ref Range   Opiates NONE DETECTED NONE DETECTED   Cocaine NONE DETECTED NONE DETECTED   Benzodiazepines NONE DETECTED NONE DETECTED   Amphetamines NONE DETECTED NONE DETECTED   Tetrahydrocannabinol POSITIVE (A) NONE DETECTED   Barbiturates NONE DETECTED NONE DETECTED    Comment: (NOTE) DRUG SCREEN FOR MEDICAL PURPOSES ONLY.  IF CONFIRMATION IS NEEDED FOR ANY PURPOSE, NOTIFY LAB WITHIN 5 DAYS.  LOWEST DETECTABLE LIMITS FOR URINE DRUG SCREEN Drug Class                     Cutoff (ng/mL) Amphetamine and metabolites    1000 Barbiturate and metabolites    200 Benzodiazepine                 200 Opiates and metabolites        300 Cocaine and metabolites        300 THC                            50 Performed at [facility], [address] [place], [place] [address]     Blood Alcohol level:  Lab Results  Component Value Date   ETH 181 (H) 08/14/2023    Metabolic Disorder Labs: No results found for: HGBA1C, MPG No results found for: PROLACTIN No results found for: CHOL, TRIG, HDL, CHOLHDL, VLDL, LDLCALC  Physical Findings: AIMS:  ,  ,  ,  ,  ,  ,   CIWA:    COWS:  Musculoskeletal: Strength & Muscle Tone: within normal limits Gait & Station: normal Patient leans: N/A  Psychiatric Specialty Exam:  Presentation  General Appearance:  Casual  Eye Contact: Poor  Speech: Clear and Coherent; Slow  Speech Volume: Decreased  Handedness:No data recorded  Mood and Affect  Mood: -- (fine)  Affect: Depressed; Constricted; Non-Congruent   Thought Process  Thought Processes: Coherent  Descriptions of Associations:Intact  Orientation:Full (Time, Place and Person)  Thought Content:Logical; WDL  History of Schizophrenia/Schizoaffective disorder:No data recorded Duration of Psychotic Symptoms:No data recorded Hallucinations:Hallucinations: None  Ideas of Reference:None  Suicidal Thoughts:Suicidal Thoughts: No  Homicidal Thoughts:Homicidal Thoughts: No   Sensorium  Memory: Immediate Fair; Recent Fair  Judgment: Poor  Insight: Poor   Executive Functions  Concentration: Fair  Attention Span: Fair  Recall: Fair  Fund of Knowledge: Fair  Language: Fair   Psychomotor Activity  Psychomotor Activity: Psychomotor Activity: Normal   Assets  Assets: Communication Skills; Resilience; Physical Health   Sleep  Sleep: Sleep: Fair    Physical Exam: Physical Exam Vitals and nursing note reviewed.  Constitutional:      Appearance: Normal appearance. He is normal weight.  Pulmonary:     Effort: Pulmonary effort is normal.    Review of Systems  All other systems reviewed and are negative.  Blood pressure 112/69, pulse (!) 49,  temperature (!) 97.3 F (36.3 C), resp. rate 16, height 6' 2 (1.88 m), weight 87.5 kg, SpO2 100%. Body mass index is 24.78 kg/m.   Treatment Plan Summary: NYSHAWN GOWDY is a 33 year old male with a past psychiatric history notable for a prior suicide attempt with a firearm in childhood, who presented involuntarily to [facility] on 7/20 from [facility] Emergency Department following a suicidal attempt where he was found by police on 7/19 leaning over the side of a bridge.  At this time, the patient continues to decline initiation of medication and demonstrates limited insight, minimizing both his current symptoms and the seriousness of his recent suicidal behavior.  Given his high risk for suicide and recent lethality of intent, we will continue to maintain IVC for the full 72-hour observation period.  We will continue to engage the patient in supportive interventions, explore his ambivalence towards treatment, and encourage initiation of medication management to address underlying mood symptoms.  Daily contact with patient to assess and evaluate symptoms and progress in treatment  Alan Maiden, MD 08/16/2023, 9:51 AM

## 2023-08-16 NOTE — BH IP Treatment Plan (Signed)
 Interdisciplinary Treatment and Diagnostic Plan Update  08/16/2023 Time of Session: 1015 Vincent Carrillo MRN: [id number]  Principal Diagnosis: MDD (major depressive disorder)  Secondary Diagnoses: Principal Problem:   MDD (major depressive disorder)   Current Medications:  Current Facility-Administered Medications  Medication Dose Route Frequency Provider Last Rate Last Admin   acetaminophen  (TYLENOL ) tablet 650 mg  650 mg Oral Q6H PRN Motley-Mangrum, Jadeka A, PMHNP       alum & mag hydroxide-simeth (MAALOX/MYLANTA) 200-200-20 MG/5ML suspension 30 mL  30 mL Oral Q4H PRN Motley-Mangrum, Jadeka A, PMHNP       diphenhydrAMINE  (BENADRYL ) capsule 25 mg  25 mg Oral Q6H PRN Bobbitt, Shalon E, NP   25 mg at 08/14/23 2132   haloperidol  (HALDOL ) tablet 5 mg  5 mg Oral TID PRN Motley-Mangrum, Jadeka A, PMHNP       And   diphenhydrAMINE  (BENADRYL ) capsule 50 mg  50 mg Oral TID PRN Motley-Mangrum, Jadeka A, PMHNP       haloperidol  lactate (HALDOL ) injection 5 mg  5 mg Intramuscular TID PRN Motley-Mangrum, Jadeka A, PMHNP       And   diphenhydrAMINE  (BENADRYL ) injection 50 mg  50 mg Intramuscular TID PRN Motley-Mangrum, Jadeka A, PMHNP       And   LORazepam  (ATIVAN ) injection 2 mg  2 mg Intramuscular TID PRN Motley-Mangrum, Jadeka A, PMHNP       haloperidol  lactate (HALDOL ) injection 10 mg  10 mg Intramuscular TID PRN Motley-Mangrum, Jadeka A, PMHNP       And   diphenhydrAMINE  (BENADRYL ) injection 50 mg  50 mg Intramuscular TID PRN Motley-Mangrum, Jadeka A, PMHNP       And   LORazepam  (ATIVAN ) injection 2 mg  2 mg Intramuscular TID PRN Motley-Mangrum, Jadeka A, PMHNP       hydrOXYzine  (ATARAX ) tablet 25 mg  25 mg Oral TID PRN Motley-Mangrum, Jadeka A, PMHNP       magnesium  hydroxide (MILK OF MAGNESIA) suspension 30 mL  30 mL Oral Daily PRN Motley-Mangrum, Jadeka A, PMHNP       nicotine  polacrilex (NICORETTE ) gum 2 mg  2 mg Oral PRN Pashayan, Trisha S, DO   2 mg at 08/16/23 1523   PTA  Medications: Medications Prior to Admission  Medication Sig Dispense Refill Last Dose/Taking   ibuprofen (ADVIL,MOTRIN) 200 MG tablet Take 200-400 mg by mouth every 6 (six) hours as needed (for pain or headaches).        Patient Stressors: Legal issue   Marital or family conflict    Patient Strengths: Average or above average intelligence  Capable of independent living  Motivation for treatment/growth   Treatment Modalities: Medication Management, Group therapy, Case management,  1 to 1 session with clinician, Psychoeducation, Recreational therapy.   Physician Treatment Plan for Primary Diagnosis: MDD (major depressive disorder) Long Term Goal(s):     Short Term Goals: Ability to identify changes in lifestyle to reduce recurrence of condition will improve Ability to verbalize feelings will improve Ability to disclose and discuss suicidal ideas Ability to demonstrate self-control will improve Ability to identify and develop effective coping behaviors will improve Ability to maintain clinical measurements within normal limits will improve Ability to identify triggers associated with substance abuse/mental health issues will improve  Medication Management: Evaluate patient's response, side effects, and tolerance of medication regimen.  Therapeutic Interventions: 1 to 1 sessions, Unit Group sessions and Medication administration.  Evaluation of Outcomes: Not Progressing  Physician Treatment Plan for Secondary Diagnosis: Principal Problem:  MDD (major depressive disorder)  Long Term Goal(s):     Short Term Goals: Ability to identify changes in lifestyle to reduce recurrence of condition will improve Ability to verbalize feelings will improve Ability to disclose and discuss suicidal ideas Ability to demonstrate self-control will improve Ability to identify and develop effective coping behaviors will improve Ability to maintain clinical measurements within normal limits will  improve Ability to identify triggers associated with substance abuse/mental health issues will improve     Medication Management: Evaluate patient's response, side effects, and tolerance of medication regimen.  Therapeutic Interventions: 1 to 1 sessions, Unit Group sessions and Medication administration.  Evaluation of Outcomes: Not Progressing   RN Treatment Plan for Primary Diagnosis: MDD (major depressive disorder) Long Term Goal(s): Knowledge of disease and therapeutic regimen to maintain health will improve  Short Term Goals: Ability to remain free from injury will improve, Ability to verbalize frustration and anger appropriately will improve, Ability to demonstrate self-control, Ability to participate in decision making will improve, Ability to verbalize feelings will improve, Ability to disclose and discuss suicidal ideas, Ability to identify and develop effective coping behaviors will improve, and Compliance with prescribed medications will improve  Medication Management: RN will administer medications as ordered by provider, will assess and evaluate patient's response and provide education to patient for prescribed medication. RN will report any adverse and/or side effects to prescribing provider.  Therapeutic Interventions: 1 on 1 counseling sessions, Psychoeducation, Medication administration, Evaluate responses to treatment, Monitor vital signs and CBGs as ordered, Perform/monitor CIWA, COWS, AIMS and Fall Risk screenings as ordered, Perform wound care treatments as ordered.  Evaluation of Outcomes: Not Progressing   LCSW Treatment Plan for Primary Diagnosis: MDD (major depressive disorder) Long Term Goal(s): Safe transition to appropriate next level of care at discharge, Engage patient in therapeutic group addressing interpersonal concerns.  Short Term Goals: Engage patient in aftercare planning with referrals and resources, Increase social support, Increase ability to  appropriately verbalize feelings, Increase emotional regulation, Facilitate acceptance of mental health diagnosis and concerns, Facilitate patient progression through stages of change regarding substance use diagnoses and concerns, Identify triggers associated with mental health/substance abuse issues, and Increase skills for wellness and recovery  Therapeutic Interventions: Assess for all discharge needs, 1 to 1 time with Social worker, Explore available resources and support systems, Assess for adequacy in community support network, Educate family and significant other(s) on suicide prevention, Complete Psychosocial Assessment, Interpersonal group therapy.  Evaluation of Outcomes: Not Progressing   Progress in Treatment: Attending groups: Yes. Participating in groups: Yes. Taking medication as prescribed: Yes. Toleration medication: Yes. Family/Significant other contact made: No, will contact:  -Ines Ada (girlfriend) [phone] Patient understands diagnosis: Yes. Discussing patient identified problems/goals with staff: Yes. Medical problems stabilized or resolved: Yes. Denies suicidal/homicidal ideation: Yes. Issues/concerns per patient self-inventory: No. Other: n/a  New problem(s) identified: No, Describe:  None  New Short Term/Long Term Goal(s): medication stabilization, elimination of SI thoughts, development of comprehensive mental wellness plan.   Patient Goals: I still don't fully understand why I'm here - with goal of discharge  Discharge Plan or Barriers: Patient recently admitted. CSW will continue to follow and assess for appropriate referrals and possible discharge planning.    Reason for Continuation of Hospitalization: Depression Medication stabilization Other; describe mood stabilization, discharge planning   Estimated Length of Stay: 3-5 DAYS  Last 3 [place] Suicide Severity Risk Score: Flowsheet Row [facility] Most  recent reading at 08/14/2023  1:42 PM ED from 08/14/2023 in [facility] Emergency Department at [facility] Most recent reading at 08/14/2023 12:24 AM  C-SSRS RISK CATEGORY Error: Question 2 not populated High Risk    Last PHQ 2/9 Scores:     No data to display          Scribe for Treatment Team: Darin Arndt N Makaylyn Sinyard, LCSW 08/16/2023 3:29 PM

## 2023-08-16 NOTE — Plan of Care (Addendum)
 Pt A & O x4. Reports he slept well last night with good appetite. Denies SI, HI, AVH and pain when assessed. Observed with flat affect, depressed mood and is preoccupied about discharge on interactions. Appears to minimize his symptoms when engaged I'm fine, I just need to leave here, I rather be working than being in here. I have bills to pay. Rates his anxiety 1/10, depression and anger both 0/10 I just need to leave. Safety checks maintained without incident. Tolerates meals and fluids well. Attended scheduled groups on and off unit. Emotional support, emotional and encouragement offered.   Problem: Activity: Goal: Sleeping patterns will improve Outcome: Progressing   Problem: Safety: Goal: Ability to disclose and discuss suicidal ideas will improve Outcome: Progressing

## 2023-08-16 NOTE — Plan of Care (Signed)
   Problem: Education: Goal: Emotional status will improve Outcome: Progressing Goal: Mental status will improve Outcome: Progressing   Problem: Activity: Goal: Interest or engagement in activities will improve Outcome: Progressing

## 2023-08-16 NOTE — Group Note (Addendum)
 Date:  08/16/2023 Time:  9:33 AM  Group Topic/Focus:  Goals Group:   The focus of this group is to help patients establish daily goals to achieve during treatment and discuss how the patient can incorporate goal setting into their daily lives to aide in recovery.    Participation Level:  attend  Participation Quality:  appropriate  Affect:  appropriate  Cognitive: appropriate  Insight: good  Engagement in Group:  engaged  Modes of Intervention:  discussion  Additional Comments:  Pt goal is to have patience and to talk about discharge with doctor.  Vincent Carrillo 08/16/2023, 9:33 AM

## 2023-08-16 NOTE — Progress Notes (Signed)
   08/16/23 2200  Psych Admission Type (Psych Patients Only)  Admission Status Involuntary  Psychosocial Assessment  Patient Complaints Anxiety  Eye Contact Fair  Facial Expression Flat  Affect Appropriate to circumstance  Speech Logical/coherent  Interaction Assertive  Motor Activity Other (Comment) (appropriate)  Appearance/Hygiene Unremarkable  Behavior Characteristics Cooperative  Mood Anxious;Pleasant  Thought Process  Coherency Circumstantial  Content WDL  Delusions None reported or observed  Perception WDL  Hallucination None reported or observed  Judgment WDL  Confusion None  Danger to Self  Current suicidal ideation? Denies  Agreement Not to Harm Self Yes  Description of Agreement Verbal  Danger to Others  Danger to Others None reported or observed

## 2023-08-16 NOTE — Group Note (Signed)
 Therapy Group Note  Group Topic:Other  Group Date: 08/16/2023 Start Time: 1430 End Time: 1505 Facilitators: Brittani Purdum G, OT    The primary objective of this topic is to explore and understand the concept of occupational balance in the context of daily living. The term occupational balance is defined broadly, encompassing all activities that occupy an individual's time and energy, including self-care, leisure, and work-related tasks. The goal is to guide participants towards achieving a harmonious blend of these activities, tailored to their personal values and life circumstances. This balance is aimed at enhancing overall well-being, not by equally distributing time across activities, but by ensuring that daily engagements are fulfilling and not draining. The content delves into identifying various barriers that individuals face in achieving occupational balance, such as overcommitment, misaligned priorities, external pressures, and lack of effective time management. The impact of these barriers on occupational performance, roles, and lifestyles is examined, highlighting issues like reduced efficiency, strained relationships, and potential health problems. Strategies for cultivating occupational balance are a key focus. These strategies include practical methods like time blocking, prioritizing tasks, establishing self-care rituals, decluttering, connecting with nature, and engaging in reflective practices. These approaches are designed to be adaptable and applicable to a wide range of life scenarios, promoting a proactive and mindful approach to daily living. The overall aim is to equip participants with the knowledge and tools to create a balanced lifestyle that supports their mental, emotional, and physical health, thereby improving their functional performance in daily life.   Jermany Rimel, OT     Participation Level: Engaged   Participation Quality: Independent   Behavior:  Appropriate   Speech/Thought Process: Relevant   Affect/Mood: Appropriate   Insight: Fair   Judgement: Fair      Modes of Intervention: Education  Patient Response to Interventions:  Attentive   Plan: Continue to engage patient in OT groups 2 - 3x/week.  08/16/2023  [place] KANDICE Purpura, OT   Jamina Macbeth, OT

## 2023-08-16 NOTE — Progress Notes (Signed)
(  Sleep Hours) -7.5 (Any PRNs that were needed, meds refused, or side effects to meds)- gum 2104 (Any disturbances and when (visitation, over night)-none (Concerns raised by the patient)- none (SI/HI/AVH)-denies all

## 2023-08-16 NOTE — Group Note (Signed)
 Recreation Therapy Group Note   Group Topic:Healthy Decision Making  Group Date: 08/16/2023 Start Time: 0931 End Time: 0956 Facilitators: Angelie Kram-McCall, LRT,CTRS Location: [address] Dayroom   Group Topic: Decision Making, Problem Solving, Communication  Goal Area(s) Addresses:  Patient will effectively work with peer towards shared goal.  Patient will identify factors that guided their decision making.  Patient will pro-socially communicate ideas during group session.   Behavioral Response:   Intervention: Survival Scenario - pencil, paper  Activity:  Patients were given a scenario that they were going to be stranded on a deserted [place] for several months before being rescued. Writer tasked them with making a list of 15 things they would choose to bring with them for survival. The list of items was prioritized most important to least. Each patient would come up with their own list, then work together to create a new list of 15 items while in a group of 3-5 peers. LRT discussed each person's list and how it differed from others. The debrief included discussion of priorities, good decisions versus bad decisions, and how it is important to think before acting so we can make the best decision possible. LRT tied the concept of effective communication among group members to patient's support systems outside of the hospital and its benefit post discharge.  Education: Pharmacist, community, Priorities, Support System, Discharge Planning   Education Outcome: Acknowledges education/In group clarification/Needs additional education   Affect/Mood: N/A   Participation Level: Did not attend    Clinical Observations/Individualized Feedback:     Plan: Continue to engage patient in RT group sessions 2-3x/week.   Aneli Zara-McCall, LRT,CTRS 08/16/2023 12:16 PM

## 2023-08-17 DIAGNOSIS — F332 Major depressive disorder, recurrent severe without psychotic features: Secondary | ICD-10-CM | POA: Diagnosis not present

## 2023-08-17 MED ORDER — NICOTINE POLACRILEX 2 MG MT GUM
4.0000 mg | CHEWING_GUM | OROMUCOSAL | Status: DC | PRN
  Administered 2023-08-17 – 2023-08-18 (×3): 4 mg via ORAL
  Filled 2023-08-17 (×3): qty 2

## 2023-08-17 MED ORDER — NICOTINE 7 MG/24HR TD PT24: 7.0000 mg | MEDICATED_PATCH | Freq: Every day | TRANSDERMAL | Status: DC

## 2023-08-17 NOTE — Group Note (Signed)
 Date:  08/17/2023 Time:  9:15 PM  Group Topic/Focus:  Wrap-Up Group:   The focus of this group is to help patients review their daily goal of treatment and discuss progress on daily workbooks.    Participation Level:  None  Participation Quality:  Attentive  Affect:  Appropriate  Cognitive:  Alert  Insight: None  Engagement in Group:  None  Modes of Intervention:  Discussion and Education  Additional Comments:  Pt attended wrap up group, but opted out of participating.   Rosella DELENA Pouch 08/17/2023, 9:15 PM

## 2023-08-17 NOTE — Progress Notes (Signed)
(  Sleep Hours) - 7.5 (Any PRNs that were needed, meds refused, or side effects to meds)-  no prns, no meds refused, no side effects  (Any disturbances and when (visitation, over night)- N/A (Concerns raised by the patient)- N/A (SI/HI/AVH)- Denies

## 2023-08-17 NOTE — Plan of Care (Signed)
   Problem: Education: Goal: Emotional status will improve Outcome: Progressing Goal: Mental status will improve Outcome: Progressing

## 2023-08-17 NOTE — Group Note (Signed)
 Recreation Therapy Group Note   Group Topic:Animal Assisted Therapy   Group Date: 08/17/2023 Start Time: 9052 End Time: 1030 Facilitators: Tashara Suder-McCall, LRT,CTRS Location: [address] Dayroom   Animal-Assisted Activity (AAA) Program Checklist/Progress Notes Patient Eligibility Criteria Checklist & Daily Group note for Rec Tx Intervention  AAA/T Program Assumption of Risk Form signed by Patient/ or Parent Legal Guardian Yes  Patient is free of allergies or severe asthma Yes  Patient reports no fear of animals Yes  Patient reports no history of cruelty to animals Yes  Patient understands his/her participation is voluntary Yes  Patient washes hands before animal contact Yes  Patient washes hands after animal contact Yes  Behavioral Response: Engaged   Education: Charity fundraiser, Appropriate Animal Interaction   Education Outcome: Acknowledges education.    Affect/Mood: Appropriate   Participation Level: Engaged   Participation Quality: Independent   Behavior: Appropriate   Speech/Thought Process: Focused   Insight: Good   Judgement: Good   Modes of Intervention: Teaching laboratory technician   Patient Response to Interventions:  Engaged   Education Outcome:  In group clarification offered    Clinical Observations/Individualized Feedback:  Patient attended session and interacted appropriately with therapy dog and peers. Patient asked appropriate questions about therapy dog and his training. Patient shared stories about their pets at home with group.   Plan: Continue to engage patient in RT group sessions 2-3x/week.   Shasha Buchbinder-McCall, LRT,CTRS 08/17/2023 12:25 PM

## 2023-08-17 NOTE — Group Note (Signed)
 LCSW Group Therapy Note   Group Date: 08/17/2023 Start Time: 1100 End Time: 1200   Participation:  patient was present  Type of Therapy:  Group Therapy  Topic:  Stress Less:  Nurturing Your Body and Mind through Calm  Objective:  Learn techniques for managing stress through body relaxation, mindfulness, and self-compassion.  Goals: Use body relaxation techniques, such as Box Breathing and Progressive Muscle Relaxation, to reduce physical tension. Practice mindfulness to break the cycle of overthinking and mental chatter. Embrace self-compassion to handle stress with kindness and resilience.  Summary:  Today's session focused on calming the body with relaxation techniques, breaking the cycle of stress with mindfulness, and using self-compassion to manage challenges more gracefully. These tools help reduce stress and foster a balanced, peaceful mindset.  Therapeutic Modalities used:  Elements of CBT ( cognitive restructuring)  Elements of DBT (box breathing, progressive body relaxation, mindfulness, acceptance)    Hajime Asfaw O Raiford Fetterman, LCSWA 08/17/2023  7:05 PM

## 2023-08-17 NOTE — Progress Notes (Signed)
   08/17/23 0530  15 Minute Checks  Location Bedroom  Visual Appearance Calm  Behavior Sleeping  Sleep (Behavioral Health Patients Only)  Calculate sleep? (Click Yes once per 24 hr at 0600 safety check) Yes  Documented sleep last 24 hours 7.5

## 2023-08-17 NOTE — [facility] Suicide Risk Assessment (Addendum)
 [facility] INPATIENT:  Family/Significant Other Suicide Prevention Education  Suicide Prevention Education:  Education Completed; Ines Ada (girlfriend) [phone],  has been identified by the patient as the family member/significant other with whom the patient will be residing, and identified as the person(s) who will aid the patient in the event of a mental health crisis (suicidal ideations/suicide attempt).  With written consent from the patient, the family member/significant other has been provided the following suicide prevention education, prior to the and/or following the discharge of the patient.  Spoke w/ GF who confirmed that the two live together. She is able to provide transportation at discharge and vocalized her willingness to be an ongoing support. She visited Pt initially during hospital stay and has maintained regular phone contact with him as well. Per Sherryle he does sound improved and she is aware that he misses his animals. She denies any additional concerns and had no additional questions regarding course of hospital stay.   The suicide prevention education provided includes the following: Suicide risk factors Suicide prevention and interventions National Suicide Hotline telephone number [facility] assessment telephone number [facility] Emergency Assistance 911 [facility] and/or Residential Mobile Crisis Unit telephone number  Request made of family/significant other to: Remove weapons (e.g., guns, rifles, knives), all items previously/currently identified as safety concern.   Remove drugs/medications (over-the-counter, prescriptions, illicit drugs), all items previously/currently identified as a safety concern.  The family member/significant other verbalizes understanding of the suicide prevention education information provided.  The family member/significant other agrees to remove the items of safety concern listed above.  Priti Consoli N Darragh Nay,  LCSW 08/17/2023, 3:42 PM

## 2023-08-17 NOTE — Group Note (Signed)
 Date:  08/17/2023 Time:  2:26 AM  Group Topic/Focus:  Wrap-Up Group:   Alcoholics Anonymous (AA) Meeting   Participation Level:  Did Not Attend  Participation Quality:  n/a  Affect:  n/a  Cognitive:  n/a  Insight: None  Engagement in Group:  n/a  Modes of Intervention:  n/a  Additional Comments:  Patient did not attend AA  Eward Mace 08/17/2023, 2:26 AM

## 2023-08-17 NOTE — Progress Notes (Signed)
 [facility] MD Progress Note  08/17/2023 1:43 PM Vincent Carrillo  MRN:  [id number] Subjective:    Vincent Carrillo is a 33 year old male with a past psychiatric history notable for a prior suicide attempt with a firearm in childhood, who presented involuntarily to [facility] on 7/20 from [facility] following a suicidal attempt where he was found by police on 7/19 leaning over the side of a bridge.   Case was discussed in the multidisciplinary team. MAR was reviewed and patient refuses all medication except nicotine  gum.   Psychiatric Team made the following recommendations yesterday: -Continue IVC -Revisit question of starting medication for depressed mood   On interview today patient reports he slept good last night.  He reports his appetite is good.  He reports no SI, HI, and AVH.  He reports no paranoia or ideas of reference.  He continues to decline all medications, stating I am fine, I do not need medications.  Patient reports his mood is doing great saying I am excited for the therapy dog today.  Per Collateral, Mother [phone] 7/22 1:20 PM Mother and fianc report no current safety concerns and both feel he would be safe to return home.  Principal Problem: MDD (major depressive disorder) Diagnosis: Principal Problem:   MDD (major depressive disorder)  Total Time spent with patient: 45 minutes  Past Psychiatric History:  Current psychiatrist: Denies  Current therapist: Sees a therapist once a month from [facility] Previous psychiatric diagnoses: Denies Psychiatric hospitalization(s): Denies Psychotherapy history: Denies Neuromodulation history: Denies History of suicide (obtained from HPI): Attempted suicide with a firearm in childhood History of homicide or aggression (obtained in HPI): Denies  Past Medical History: History reviewed. No pertinent past medical history. History reviewed. No pertinent surgical history. Family History: History reviewed. No  pertinent family history. Family Psychiatric  History: No known family psychiatric history Social History:  Social History   Substance and Sexual Activity  Alcohol Use Not Currently     Social History   Substance and Sexual Activity  Drug Use Yes   Types: Marijuana    Social History   Socioeconomic History   Marital status: Unknown    Spouse name: Not on file   Number of children: Not on file   Years of education: Not on file   Highest education level: Not on file  Occupational History   Not on file  Tobacco Use   Smoking status: Every Day    Types: Cigarettes   Smokeless tobacco: Never  Substance and Sexual Activity   Alcohol use: Not Currently   Drug use: Yes    Types: Marijuana   Sexual activity: Yes  Other Topics Concern   Not on file  Social History Narrative   Not on file   Social Drivers of Health   Financial Resource Strain: Not on file  Food Insecurity: No Food Insecurity (08/14/2023)   Hunger Vital Sign    Worried About Running Out of Food in the Last Year: Never true    Ran Out of Food in the Last Year: Never true  Transportation Needs: No Transportation Needs (08/14/2023)   PRAPARE - Administrator, Civil Service (Medical): No    Lack of Transportation (Non-Medical): No  Physical Activity: Not on file  Stress: Not on file  Social Connections: Not on file   Additional Social History:   Sleep: Good Estimated Sleeping Duration (Last 24 Hours): 6.50-7.50 hours  Appetite:  Good  Current Medications: Current Facility-Administered Medications  Medication Dose Route Frequency Provider Last Rate Last Admin   acetaminophen  (TYLENOL ) tablet 650 mg  650 mg Oral Q6H PRN Motley-Mangrum, Jadeka A, PMHNP       alum & mag hydroxide-simeth (MAALOX/MYLANTA) 200-200-20 MG/5ML suspension 30 mL  30 mL Oral Q4H PRN Motley-Mangrum, Jadeka A, PMHNP       diphenhydrAMINE  (BENADRYL ) capsule 25 mg  25 mg Oral Q6H PRN Bobbitt, Shalon E, NP   25 mg at 08/14/23  2132   haloperidol  (HALDOL ) tablet 5 mg  5 mg Oral TID PRN Motley-Mangrum, Jadeka A, PMHNP       And   diphenhydrAMINE  (BENADRYL ) capsule 50 mg  50 mg Oral TID PRN Motley-Mangrum, Jadeka A, PMHNP       haloperidol  lactate (HALDOL ) injection 5 mg  5 mg Intramuscular TID PRN Motley-Mangrum, Jadeka A, PMHNP       And   diphenhydrAMINE  (BENADRYL ) injection 50 mg  50 mg Intramuscular TID PRN Motley-Mangrum, Jadeka A, PMHNP       And   LORazepam  (ATIVAN ) injection 2 mg  2 mg Intramuscular TID PRN Motley-Mangrum, Jadeka A, PMHNP       haloperidol  lactate (HALDOL ) injection 10 mg  10 mg Intramuscular TID PRN Motley-Mangrum, Jadeka A, PMHNP       And   diphenhydrAMINE  (BENADRYL ) injection 50 mg  50 mg Intramuscular TID PRN Motley-Mangrum, Jadeka A, PMHNP       And   LORazepam  (ATIVAN ) injection 2 mg  2 mg Intramuscular TID PRN Motley-Mangrum, Jadeka A, PMHNP       hydrOXYzine  (ATARAX ) tablet 25 mg  25 mg Oral TID PRN Motley-Mangrum, Jadeka A, PMHNP       magnesium  hydroxide (MILK OF MAGNESIA) suspension 30 mL  30 mL Oral Daily PRN Motley-Mangrum, Jadeka A, PMHNP       nicotine  polacrilex (NICORETTE ) gum 2 mg  2 mg Oral PRN Pashayan, Jamie S, DO   2 mg at 08/17/23 1046    Lab Results: No results found for this or any previous visit (from the past 48 hours).  Blood Alcohol level:  Lab Results  Component Value Date   ETH 181 (H) 08/14/2023    Metabolic Disorder Labs: No results found for: HGBA1C, MPG No results found for: PROLACTIN No results found for: CHOL, TRIG, HDL, CHOLHDL, VLDL, LDLCALC  Physical Findings: AIMS:  ,  ,  ,  ,  ,  ,   CIWA:    COWS:     Musculoskeletal: Strength & Muscle Tone: within normal limits Gait & Station: normal Patient leans: N/A  Psychiatric Specialty Exam:  Presentation  General Appearance:  Casual  Eye Contact: Good  Speech: Clear and Coherent  Speech Volume: Normal  Handedness:No data recorded  Mood and Affect   Mood: -- (I feel good. Excited about the therapy dog)  Affect: Congruent   Thought Process  Thought Processes: Coherent  Descriptions of Associations:Intact  Orientation:Full (Time, Place and Person)  Thought Content:WDL  History of Schizophrenia/Schizoaffective disorder:No data recorded Duration of Psychotic Symptoms:No data recorded Hallucinations:Hallucinations: None  Ideas of Reference:None  Suicidal Thoughts:Suicidal Thoughts: No  Homicidal Thoughts:Homicidal Thoughts: No   Sensorium  Memory: Recent Good; Remote Good; Immediate Good  Judgment: Good  Insight: Fair   Art therapist  Concentration: Good  Attention Span: Good  Recall: Good  Fund of Knowledge: Good  Language: Good   Psychomotor Activity  Psychomotor Activity: Psychomotor Activity: Normal   Assets  Assets: Housing; Financial Resources/Insurance   Sleep  Sleep: Sleep:  Good    Physical Exam: Physical Exam Vitals and nursing note reviewed.  Constitutional:      Appearance: Normal appearance. He is normal weight.  HENT:     Head: Atraumatic.  Pulmonary:     Effort: Pulmonary effort is normal.    Review of Systems  All other systems reviewed and are negative.  Blood pressure (!) 115/59, pulse (!) 50, temperature 97.9 F (36.6 C), temperature source Oral, resp. rate 16, height 6' 2 (1.88 m), weight 87.5 kg, SpO2 100%. Body mass index is 24.78 kg/m.   Treatment Plan Summary:  Vincent Carrillo is a 33 year old male with a past psychiatric history notable for a prior suicide attempt with a firearm in childhood, who presented involuntarily to [facility] on 7/20 from [facility] Department following a suicidal attempt where he was found by police on 7/19 leaning over the side of a bridge.   Since admission, he has been calm, cooperative, and without evidence of acute mood or psychotic symptoms.  He denies current SI, HI, or AVH, reports good sleep and  appetite, and continues to minimize the severity of his recent behaviors.  He remains adamantly opposed to initiating medications, stating he feels well and does not need treatment.  Collateral from both his mother and fianc reveal no current safety concerns and support for discharge.  If patient continues to do well on unit, we will plan for discharge tomorrow.  Major Depressive Disorder, severe, in current episode - We will continue IVC at this time -Continue to encourage him to open up and attend groups - Revisit question of starting medication for depressed mood, not amenable at this time.   Daily contact with patient to assess and evaluate symptoms and progress in treatment and Medication management  Alan Maiden, MD 08/17/2023, 1:43 PM

## 2023-08-17 NOTE — [facility] Group Notes (Addendum)
 Spiritual care group on grief and loss facilitated by Chaplain Rockie Sofia, [facility]   Group Goal: Support / Education around grief and loss  Members engage in facilitated group support and psycho-social education.  Group Description:  Following introductions and group rules, group members engaged in facilitated group dialogue and support around topic of loss, with particular support around experiences of loss in their lives. Group Identified types of loss (relationships / self / things) and identified patterns, circumstances, and changes that precipitate losses. Reflected on thoughts / feelings around loss, normalized grief responses, and recognized variety in grief experience. Group encouraged individual reflection on safe space and on the coping skills that they are already utilizing.  Group drew on Adlerian / Rogerian and narrative framework  Patient Progress: Vincent Carrillo attended group. Verbal participation was minimal, but he demonstrated engagement in the conversation.

## 2023-08-17 NOTE — [facility] Group Notes (Signed)
 Group Topic/Focus:  Goals Group:   The focus of this group is to help patients establish daily goals to achieve during treatment and discuss how the patient can incorporate goal setting into their daily lives to aide in recovery.       Participation Level:  Active   Participation Quality:  Attentive   Affect:  Appropriate   Cognitive:  Appropriate   Insight: Appropriate   Engagement in Group:  Engaged   Modes of Intervention:  Discussion   Additional Comments:   Patient attended goals group and was attentive the duration of it. Patient's goal was to finish reading [facility] 2 and finalize his discharge for tomorrow.

## 2023-08-18 DIAGNOSIS — F332 Major depressive disorder, recurrent severe without psychotic features: Secondary | ICD-10-CM | POA: Diagnosis not present

## 2023-08-18 MED ORDER — NICOTINE POLACRILEX 4 MG MT GUM: 4.0000 mg | CHEWING_GUM | OROMUCOSAL | 0 refills | Status: AC | PRN

## 2023-08-18 MED ORDER — NICOTINE POLACRILEX 4 MG MT GUM: 4.0000 mg | CHEWING_GUM | OROMUCOSAL | 0 refills | Status: DC | PRN

## 2023-08-18 NOTE — [facility] Suicide Risk Assessment (Incomplete)
 Suicide Risk Assessment  Discharge Assessment    [facility] Discharge Suicide Risk Assessment   Principal Problem: MDD (major depressive disorder) Discharge Diagnoses: Principal Problem:   MDD (major depressive disorder)   Total Time spent with patient: 45 minutes  Musculoskeletal: Strength & Muscle Tone: within normal limits Gait & Station: normal Patient leans: N/A  Psychiatric Specialty Exam  Presentation  General Appearance:  Casual  Eye Contact: Good  Speech: Clear and Coherent  Speech Volume: Normal  Handedness:No data recorded  Mood and Affect  Mood: -- (I feel good. Excited about the therapy dog)  Duration of Depression Symptoms: No data recorded Affect: Congruent   Thought Process  Thought Processes: Coherent  Descriptions of Associations:Intact  Orientation:Full (Time, Place and Person)  Thought Content:WDL  History of Schizophrenia/Schizoaffective disorder:No data recorded Duration of Psychotic Symptoms:No data recorded Hallucinations:Hallucinations: None  Ideas of Reference:None  Suicidal Thoughts:Suicidal Thoughts: No  Homicidal Thoughts:Homicidal Thoughts: No   Sensorium  Memory: Recent Good; Remote Good; Immediate Good  Judgment: Good  Insight: Fair   Art therapist  Concentration: Good  Attention Span: Good  Recall: Good  Fund of Knowledge: Good  Language: Good   Psychomotor Activity  Psychomotor Activity: Psychomotor Activity: Normal   Assets  Assets: Housing; Financial Resources/Insurance   Sleep  Sleep: Sleep: Good  Estimated Sleeping Duration (Last 24 Hours): 6.00-7.50 hours  Physical Exam: Physical Exam ROS Blood pressure 112/61, pulse (!) 53, temperature 97.6 F (36.4 C), resp. rate 20, height 6' 2 (1.88 m), weight 87.5 kg, SpO2 100%. Body mass index is 24.78 kg/m.  Mental Status Per Nursing Assessment::   On Admission:  NA  Demographic Factors:  Male  Loss  Factors: NA  Historical Factors: Prior suicide attempts  Risk Reduction Factors:   Sense of responsibility to family, Employed, and Living with another person, especially a relative  Continued Clinical Symptoms:  Depression:   Impulsivity  Cognitive Features That Contribute To Risk:  None    Suicide Risk:  Mild:  Suicidal ideation of limited frequency, intensity, duration, and specificity.  There are no identifiable plans, no associated intent, mild dysphoria and related symptoms, good self-control (both objective and subjective assessment), few other risk factors, and identifiable protective factors, including available and accessible social support.   Follow-up Information     [facility] Follow up on 08/20/2023.   Why: Please contact your provider on 08/20/23 at 9:00 am to schedule your next appointment for therapy services. Contact information: [email or web address]                Plan Of Care/Follow-up recommendations:  {[facility] DC FU RECOMMENDATIONS:22620}  Alan Maiden, MD 08/18/2023, 8:01 AM

## 2023-08-18 NOTE — [facility] Suicide Risk Assessment (Addendum)
 [facility] Discharge Suicide Risk Assessment   Principal Problem: MDD (major depressive disorder) Discharge Diagnoses: Principal Problem:   MDD (major depressive disorder)  During the patient's hospitalization, patient had extensive initial psychiatric evaluation, and follow-up psychiatric evaluations every day.  Psychiatric diagnoses provided upon initial assessment: MDD  Patient's psychiatric medications were adjusted on admission: Declined to start any medications.  During the hospitalization, other adjustments were made to the patient's psychiatric medication regimen: None  Gradually, patient started adjusting to milieu.   Patient's care was discussed during the interdisciplinary team meeting every day during the hospitalization.   The patient reports their target psychiatric symptoms of depression responded well, and the patient reports overall benefit other psychiatric hospitalization. Supportive psychotherapy was provided to the patient. The patient also participated in regular group therapy while admitted.   Labs were reviewed with the patient, and abnormal results were discussed with the patient.  The patient denied having suicidal thoughts more than 48 hours prior to discharge.  Patient denies having homicidal thoughts.  Patient denies having auditory hallucinations.  Patient denies any visual hallucinations.  Patient denies having paranoid thoughts.  The patient is able to verbalize their individual safety plan to this provider.  It is recommended to the patient to continue psychiatric medications as prescribed, after discharge from the hospital.    It is recommended to the patient to follow up with your outpatient psychiatric provider and PCP.  Discussed with the patient, the impact of alcohol, drugs, tobacco have been there overall psychiatric and medical wellbeing, and total abstinence from substance use was recommended the patient.  Total Time spent with patient: 20  minutes  Musculoskeletal: Strength & Muscle Tone: within normal limits Gait & Station: normal Patient leans: N/A  Psychiatric Specialty Exam  Presentation  General Appearance:  Casual; Appropriate for Environment  Eye Contact: Good  Speech: Clear and Coherent; Normal Rate  Speech Volume: Normal  Handedness:No data recorded  Mood and Affect  Mood: Euthymic  Duration of Depression Symptoms: No data recorded Affect: Congruent; Appropriate   Thought Process  Thought Processes: Coherent; Goal Directed  Descriptions of Associations:Intact  Orientation:Full (Time, Place and Person)  Thought Content:Logical; WDL  History of Schizophrenia/Schizoaffective disorder:No data recorded Duration of Psychotic Symptoms:No data recorded Hallucinations:Hallucinations: None  Ideas of Reference:None  Suicidal Thoughts:Suicidal Thoughts: No  Homicidal Thoughts:Homicidal Thoughts: No   Sensorium  Memory: Immediate Good; Recent Good  Judgment: Good  Insight: Good   Executive Functions  Concentration: Good  Attention Span: Good  Recall: Good  Fund of Knowledge: Good  Language: Good   Psychomotor Activity  Psychomotor Activity: Psychomotor Activity: Normal   Assets  Assets: Communication Skills; Resilience; Physical Health; Housing; Financial Resources/Insurance   Sleep  Sleep: Sleep: Fair  Estimated Sleeping Duration (Last 24 Hours): 6.00-7.50 hours  Physical Exam: Physical Exam Vitals and nursing note reviewed.  Constitutional:      General: He is not in acute distress.    Appearance: Normal appearance. He is normal weight. He is not ill-appearing or toxic-appearing.  HENT:     Head: Normocephalic and atraumatic.  Pulmonary:     Effort: Pulmonary effort is normal.  Musculoskeletal:        General: Normal range of motion.  Neurological:     General: No focal deficit present.     Mental Status: He is alert.    Review of Systems   Respiratory:  Negative for cough and shortness of breath.   Cardiovascular:  Negative for chest pain.  Gastrointestinal:  Negative for abdominal pain, constipation, diarrhea, nausea and vomiting.  Neurological:  Negative for dizziness, weakness and headaches.  Psychiatric/Behavioral:  Negative for depression, hallucinations and suicidal ideas. The patient is not nervous/anxious.    Blood pressure 112/61, pulse (!) 53, temperature 97.6 F (36.4 C), resp. rate 20, height 6' 2 (1.88 m), weight 87.5 kg, SpO2 100%. Body mass index is 24.78 kg/m.  Mental Status Per Nursing Assessment::   On Admission:  NA  Demographic Factors:  Male and Caucasian  Loss Factors: Financial problems/change in socioeconomic status  Historical Factors: NA  Risk Reduction Factors:   Sense of responsibility to family, Employed, Living with another person, especially a relative, Positive social support, and Positive coping skills or problem solving skills  Continued Clinical Symptoms:  None  Cognitive Features That Contribute To Risk:  None    Suicide Risk:  Minimal: No identifiable suicidal ideation.  Patients presenting with no risk factors but with morbid ruminations; may be classified as minimal risk based on the severity of the depressive symptoms   Follow-up Information     [facility] Follow up on 08/20/2023.   Why: Please contact your provider on 08/20/23 at 9:00 am to schedule your next appointment for therapy services. Contact information: [email or web address]                Plan Of Care/Follow-up recommendations:  Activity: as tolerated  Diet: heart healthy  Other: -Follow-up with your outpatient psychiatric provider -instructions on appointment date, time, and address (location) are provided to you in discharge paperwork.  -Take your psychiatric medications as prescribed at discharge - instructions are provided to you in the discharge paperwork  -Follow-up with outpatient  primary care doctor and other specialists -for management of chronic medical disease, including: Routine Care  -Testing: Follow-up with outpatient provider for abnormal lab results: None  -Recommend abstinence from alcohol, tobacco, and other illicit drug use at discharge.   -If your psychiatric symptoms recur, worsen, or if you have side effects to your psychiatric medications, call your outpatient psychiatric provider, 911, [phone] or go to the nearest emergency department.  -If suicidal thoughts recur, call your outpatient psychiatric provider, 911, [phone] or go to the nearest emergency department.   Marsa GORMAN Rosser, DO 08/18/2023, 8:35 AM

## 2023-08-18 NOTE — Progress Notes (Signed)
 Patient discharged to home accompanied by friend. Discharge instructions, prescription and all required discharge documents given to pt with verbalization of understanding. No personals were brought in on admission. Pt escorted to lobby by RN at 1012.  08/18/23 0856  Psych Admission Type (Psych Patients Only)  Admission Status Involuntary  Psychosocial Assessment  Patient Complaints None  Eye Contact Fair  Facial Expression Animated  Affect Appropriate to circumstance  Speech Logical/coherent  Interaction Assertive  Motor Activity Other (Comment) (WNL)  Appearance/Hygiene Unremarkable  Behavior Characteristics Cooperative  Mood Pleasant  Thought Process  Coherency WDL  Content WDL  Delusions None reported or observed  Perception WDL  Hallucination None reported or observed  Judgment WDL  Confusion None  Danger to Self  Current suicidal ideation? Denies  Agreement Not to Harm Self Yes  Description of Agreement Verbal  Danger to Others  Danger to Others None reported or observed
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
## 2023-08-18 NOTE — Plan of Care (Signed)
  Problem: Activity: Goal: Interest or engagement in activities will improve Outcome: Progressing Goal: Sleeping patterns will improve Outcome: Progressing   Problem: Coping: Goal: Ability to verbalize frustrations and anger appropriately will improve Outcome: Progressing Goal: Ability to demonstrate self-control will improve Outcome: Progressing   Problem: Safety: Goal: Periods of time without injury will increase Outcome: Progressing   Problem: Health Behavior/Discharge Planning: Goal: Identification of resources available to assist in meeting health care needs will improve Outcome: Progressing Goal: Compliance with treatment plan for underlying cause of condition will improve Outcome: Progressing

## 2023-08-18 NOTE — Progress Notes (Signed)
 [facility] Adult Case Management Discharge Plan :  Will you be returning to the same living situation after discharge:  Yes,  home w/ GF. At discharge, do you have transportation home?: Yes,  GF to provide. Do you have the ability to pay for your medications: No. Pt is not discharging with medications.  Release of information consent forms completed and in the chart;  Patient's signature needed at discharge.  Patient to Follow up at:  Follow-up Information     [facility] Follow up on 08/20/2023.   Why: Please contact your provider on 08/20/23 at 9:00 am to schedule your next appointment for therapy services. Contact information: [email or web address]                Next level of care provider has access to [facility] Link:no  Safety Planning and Suicide Prevention discussed: Yes,  completed w/ Pt's GF.  Has patient been referred to the Quitline?: Patient refused referral for treatment  Patient has been referred for addiction treatment: No known substance use disorder.  Vincent Hutmacher N Reyes [place], LCSW 08/18/2023, 9:10 AM

## 2023-08-18 NOTE — Plan of Care (Signed)
   Problem: Education: Goal: Emotional status will improve Outcome: Progressing

## 2023-08-18 NOTE — [facility] Group Notes (Signed)
 Type of Therapy:  Group Topic/ Focus: Goals Group: The focus of this group is to help patients establish daily goals to achieve during treatment and discuss how the patient can incorporate goal setting into their daily lives to aide in recovery.    Participation Level:  Active   Participation Quality:  Appropriate   Affect:  Appropriate   Cognitive:  Appropriate   Insight:  Appropriate   Engagement in Group:  Engaged   Modes of Intervention:  Discussion   Summary of Progress/Problems:   Patient attended and participated goals group today. No SI/HI. Patient's goal for today is to stay positive and prepare for discharge.

## 2023-08-18 NOTE — Progress Notes (Signed)
 Patient denies all and he reports that he is looking forward to discharge today.  Pt seemed to sleep well through out the night.
